# Patient Record
Sex: Male | Born: 1970 | Race: White | Hispanic: No | Marital: Married | Smoking: Former smoker
Health system: Southern US, Community
[De-identification: ages and names within clinical notes are randomized; demographics above are authoritative.]

## PROBLEM LIST (undated history)

## (undated) DIAGNOSIS — M545 Low back pain, unspecified: Secondary | ICD-10-CM

## (undated) HISTORY — PX: ABDOMINAL SURGERY: SHX537

## (undated) HISTORY — PX: COLON SURGERY: SHX602

## (undated) HISTORY — DX: Low back pain: M54.5

## (undated) HISTORY — DX: Low back pain, unspecified: M54.50

---

## 2009-10-18 ENCOUNTER — Emergency Department: Payer: Self-pay | Admitting: Emergency Medicine

## 2011-01-06 ENCOUNTER — Emergency Department: Payer: Self-pay | Admitting: *Deleted
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
## 2012-08-17 ENCOUNTER — Ambulatory Visit: Payer: Self-pay

## 2014-03-18 IMAGING — CT CT ABD-PELV W/ CM
1 of 2 series · 15 of 32 positions shown, 19 images · IV contrast (isovue)
Comparison: 01/06/2011

REASON FOR EXAM: LLQ abd pain
COMMENTS:

PROCEDURE:     CT  - CT ABDOMEN / PELVIS  W  - August 17, 2012  [DATE]
RESULT:     History: Abdominal pain
TECHNIQUE: Multiple axial images of the abdomen and pelvis were performed
from the lung bases to the pubic symphysis, with p.o. contrast and with 100
ml of Isovue .300 intravenous contrast.

[Series 2: 3mm soft tissue · axial · 0.72mm/px · z∈[-334,+150]mm · 15 of 175 slices shown, 19 images]
[im 7/175  soft-tissue]
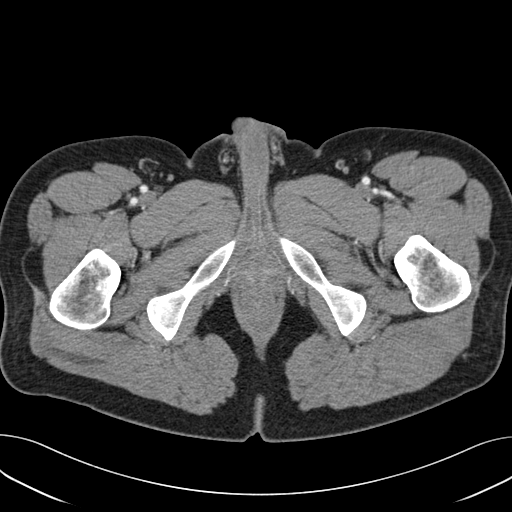
[im 7/175  bone]
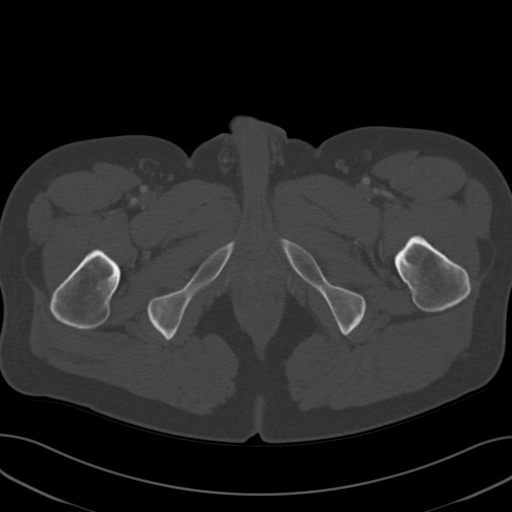
[im 21/175  soft-tissue]
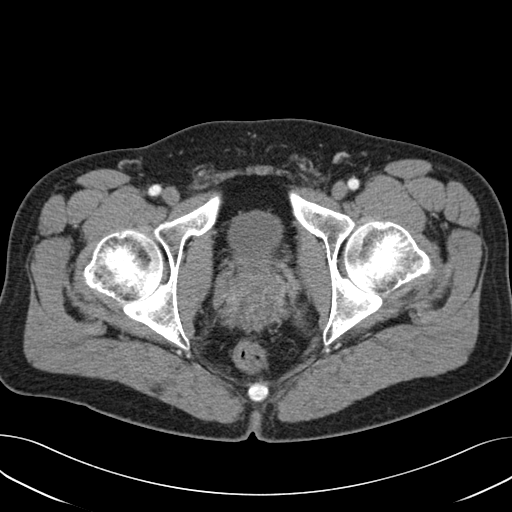
[im 35/175  soft-tissue]
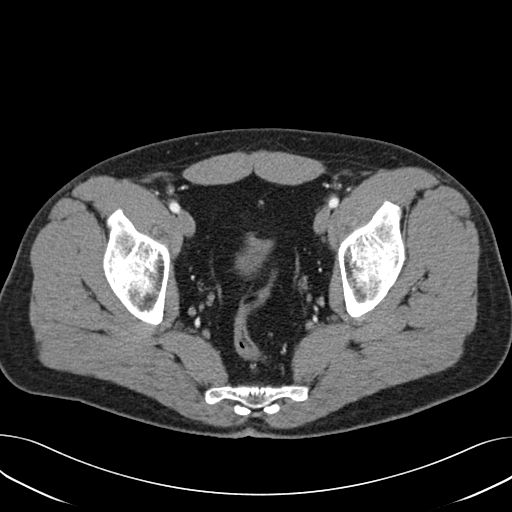
[im 49/175  soft-tissue]
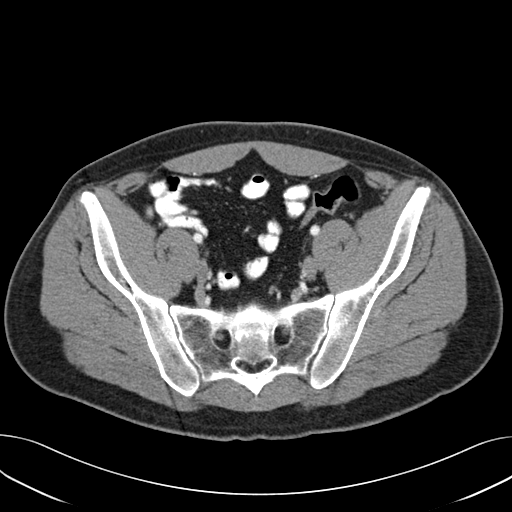
[im 63/175  soft-tissue]
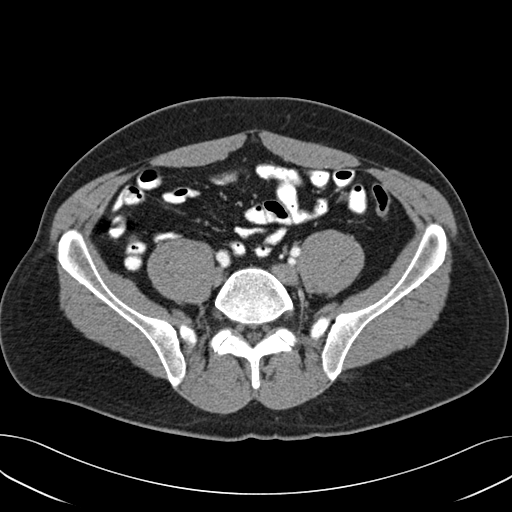
[im 77/175  soft-tissue]
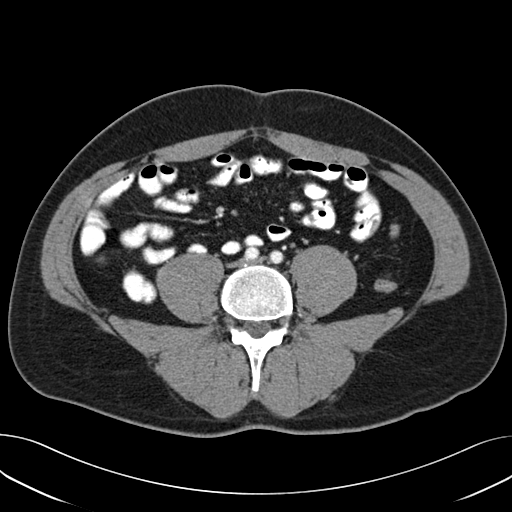
[im 91/175  soft-tissue]
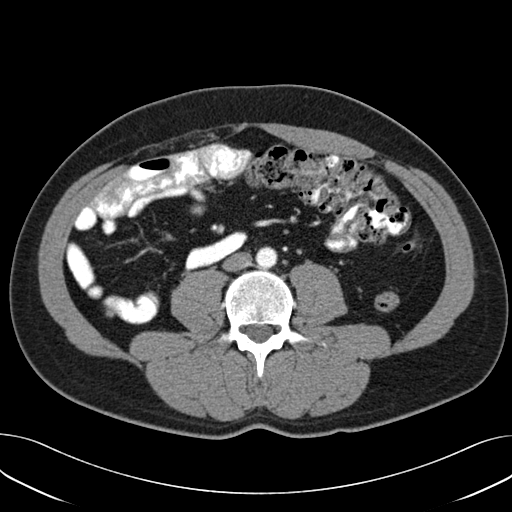
[im 98/175  soft-tissue]
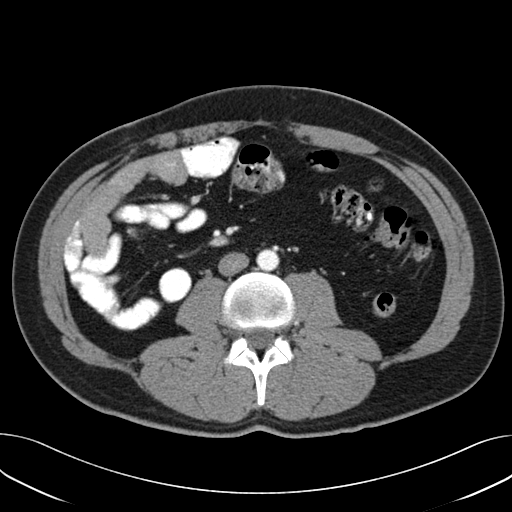
[im 112/175  soft-tissue]
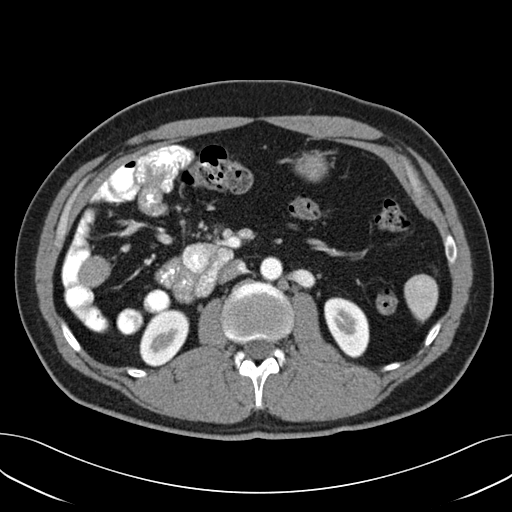
[im 112/175  bone]
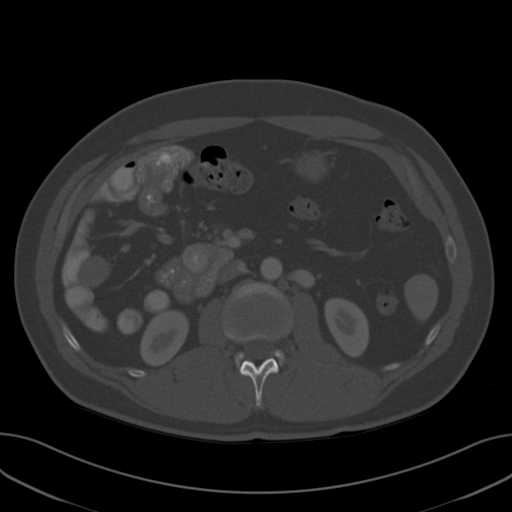
[im 126/175  soft-tissue]
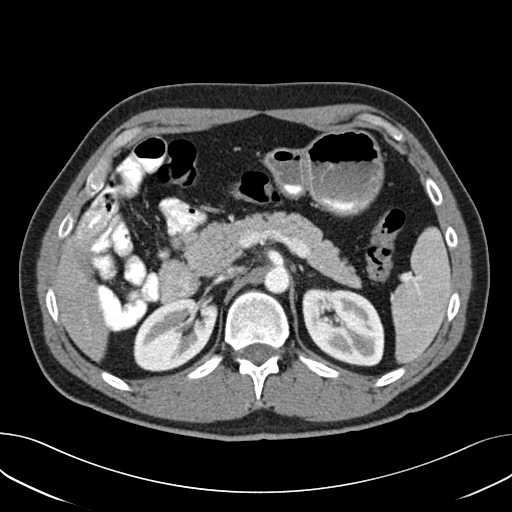
[im 140/175  soft-tissue]
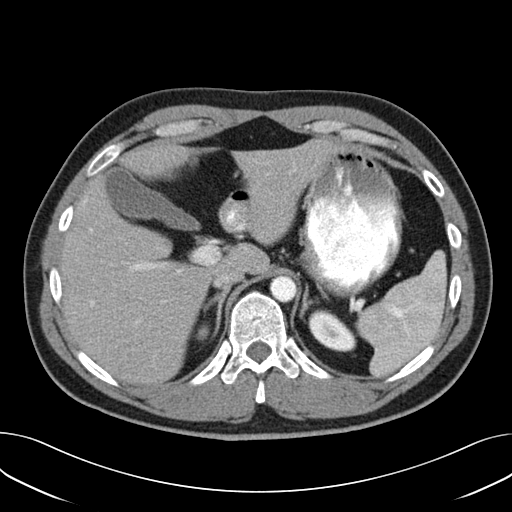
[im 147/175  lung]
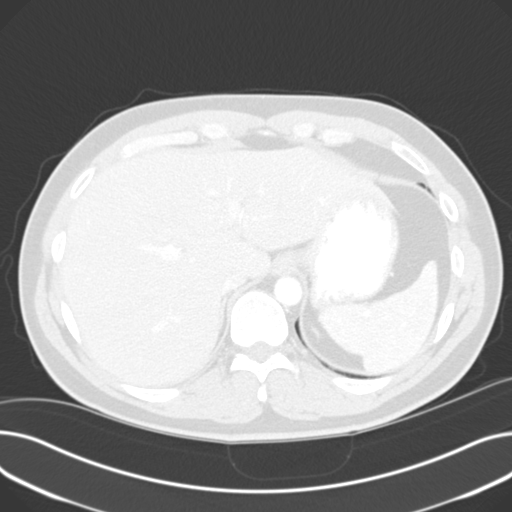
[im 154/175  soft-tissue]
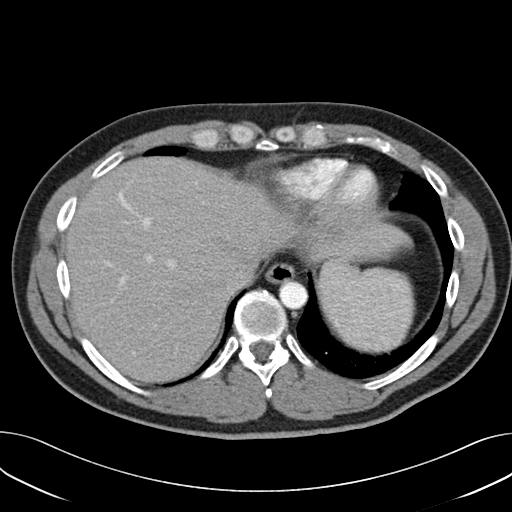
[im 154/175  lung]
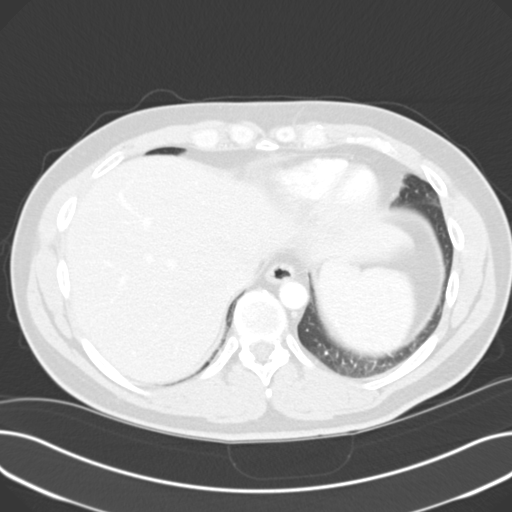
[im 161/175  lung]
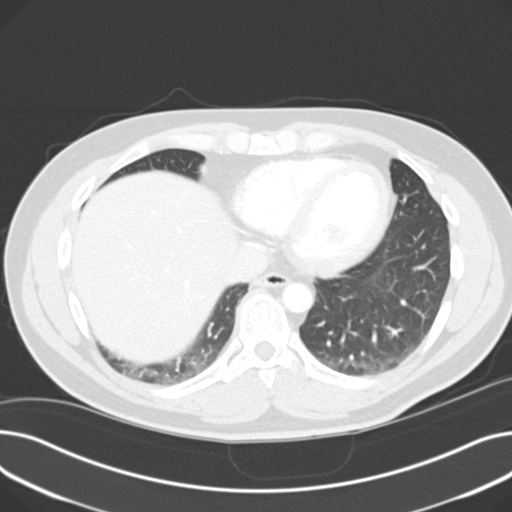
[im 168/175  soft-tissue]
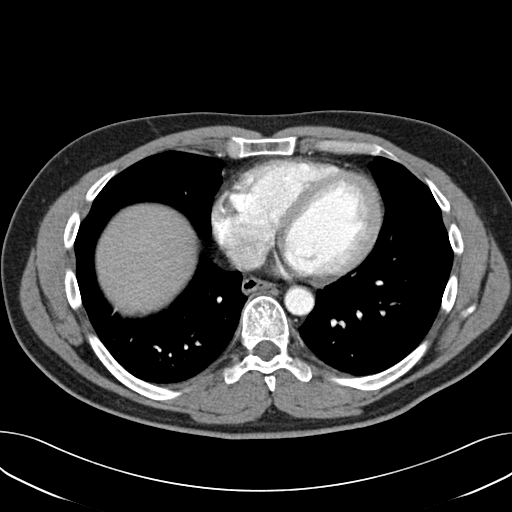
[im 168/175  lung]
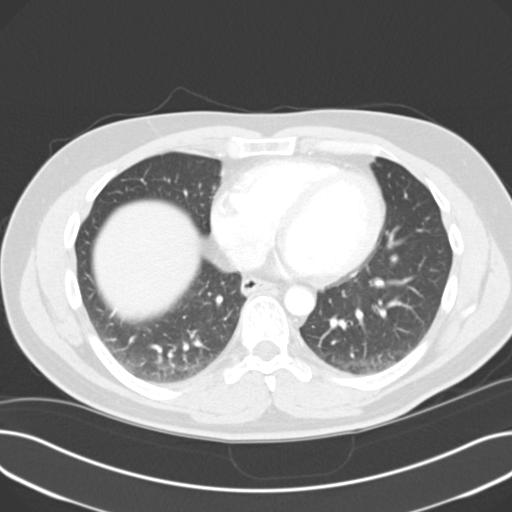

[15 of 32 positions shown; findings below may reference images not displayed]

FINDINGS: The lung bases are clear. There is no pneumothorax. The heart size is
normal.

The liver demonstrates no focal abnormality. There is no intrahepatic or
extrahepatic biliary ductal dilatation. The gallbladder is unremarkable. The
spleen demonstrates no focal abnormality. The kidneys, adrenal glands, and
pancreas are normal. The bladder is unremarkable.

The stomach, duodenum, small intestine, and large intestine demonstrate no
contrast extravasation or dilatation.  There is no pneumoperitoneum,
pneumatosis, or portal venous gas. There is no abdominal or pelvic free
fluid. There is no lymphadenopathy.

The abdominal aorta is normal in caliber .

The osseous structures are unremarkable.
IMPRESSION: 1. No acute abdominal or pelvic pathology.

[REDACTED]

## 2014-10-31 DIAGNOSIS — M545 Low back pain, unspecified: Secondary | ICD-10-CM | POA: Insufficient documentation

## 2014-11-02 ENCOUNTER — Ambulatory Visit: Payer: Self-pay | Admitting: Unknown Physician Specialty

## 2018-09-26 ENCOUNTER — Other Ambulatory Visit: Payer: Self-pay

## 2018-09-26 ENCOUNTER — Ambulatory Visit: Payer: Self-pay | Admitting: *Deleted

## 2018-09-26 DIAGNOSIS — Z20822 Contact with and (suspected) exposure to covid-19: Secondary | ICD-10-CM

## 2018-09-26 NOTE — Telephone Encounter (Signed)
Beverley Fiedler, NP called requesting to schedule a patient, Russell Hutchinson, for covid-19 testing. She stated the patient has had nausea, vomiting, diarrhea for 7 days and a generalized malaise.   Pt was notified regarding request to tested for the covid-19 test and scheduled for today 09/26/18 at 2 pm at the [facility] in [place]. He was advised to wear a mask, stay in the car and keep his window rolled up until time for his testing. Pt voiced understanding.

## 2018-09-29 LAB — NOVEL CORONAVIRUS, NAA: SARS-CoV-2, NAA: NOT DETECTED

## 2019-02-21 ENCOUNTER — Encounter: Payer: Self-pay | Admitting: Emergency Medicine

## 2019-02-21 ENCOUNTER — Other Ambulatory Visit: Payer: Self-pay

## 2019-02-21 ENCOUNTER — Ambulatory Visit
Admission: EM | Admit: 2019-02-21 | Discharge: 2019-02-21 | Disposition: A | Discharge status: Home / Self Care | Payer: HRSA Program | Attending: Urgent Care | Admitting: Urgent Care

## 2019-02-21 DIAGNOSIS — Z20822 Contact with and (suspected) exposure to covid-19: Secondary | ICD-10-CM

## 2019-02-21 DIAGNOSIS — B349 Viral infection, unspecified: Secondary | ICD-10-CM

## 2019-02-21 DIAGNOSIS — R519 Headache, unspecified: Secondary | ICD-10-CM | POA: Diagnosis not present

## 2019-02-21 DIAGNOSIS — Z20828 Contact with and (suspected) exposure to other viral communicable diseases: Secondary | ICD-10-CM

## 2019-02-21 DIAGNOSIS — R5383 Other fatigue: Secondary | ICD-10-CM

## 2019-02-21 DIAGNOSIS — Z7189 Other specified counseling: Secondary | ICD-10-CM

## 2019-02-21 DIAGNOSIS — R112 Nausea with vomiting, unspecified: Secondary | ICD-10-CM

## 2019-02-21 MED ORDER — ONDANSETRON 4 MG PO TBDP: 4.0000 mg | ORAL_TABLET | Freq: Three times a day (TID) | ORAL | 0 refills | Status: DC | PRN

## 2019-02-21 NOTE — Discharge Instructions (Signed)
It was very nice seeing you today in clinic. Thank you for entrusting me with your care.   Rest and increase fluid intake as much as possible. Water is always best, as sugar and caffeine containing fluids can cause you to become dehydrated. Try to incorporate electrolyte enriched fluids, such as Gatorade or Pedialyte, into your daily fluid intake.  I have sent you in some nausea medication. Your prescriptions has been called in to your pharmacy.   You were tested for SARS-CoV-2 (novel coronavirus) today. Testing is performed by an outside lab ([facility]) and has variable turn around times ranging between 2-5 days. Current recommendations from the the CDC and [place] DHHS require that you remain out of work in order to quarantine at home until negative test results are have been received. In the event that your test results are positive, you will be contacted with further directives. These measures are being implemented out of an abundance of caution to prevent transmission and spread during the current SARS-CoV-2 pandemic.  Make arrangements to follow up with your regular doctor in 1 week for re-evaluation if not improving. If your symptoms/condition worsens, please seek follow up care either here or in the ER. Please remember, our [place] providers are "right here with you" when you need [place].   Again, it was my pleasure to take care of you today. Thank you for choosing our clinic. I hope that you start to feel better quickly.   Honor Loh, MSN, APRN, FNP-C, CEN Advanced Practice Provider [facility]

## 2019-02-21 NOTE — ED Triage Notes (Signed)
Patient in today c/o emesis since yesterday. Patient denies fever or diarrhea.

## 2019-02-22 NOTE — ED Provider Notes (Signed)
Russell Hutchinson, Russell Hutchinson   Name: Russell Hutchinson DOB: Jun 06, 1970 MRN: [id number] CSN: [id number] PCP: Patient, No Pcp Per  Arrival date and time:  02/21/19 1731  Chief Complaint:  Emesis   NOTE: Prior to seeing the patient today, I have reviewed the triage nursing documentation and vital signs. Clinical staff has updated patient's PMH/PSHx, current medication list, and drug allergies/intolerances to ensure comprehensive history available to assist in medical decision making.   History:   HPI: Russell Hutchinson is a 48 y.o. male who presents today with complaints of multiple emesis episodes since yesterday. Patient unable to advise how many times he has vomited beyond stating "a lot". Emesis described as being bilious with no evidence of hematemesis. Patient denies any fevers, chills, abdominal pain, and urinary symptoms. He reports that he has had a generalized headache and mild vertiginous symptoms. Presenting VS reviewed as normal; no fever, hypotension, or tachycardia. Vertiginous symptoms mainly associated with rapid position changes; no noted orthostasis. Patient has maintained a bland diet which has allowed him to be able to eat and drink "fine". He has been consuming mainly water per his report. Patient reports a PMH (+) for recurrent nausea and vomiting that he attributes to major changes in his work schedule. Patient denies being in close contact with anyone known to be ill. He has been tested for SARS-CoV-2 (novel coronavirus) back in 09/2018. Despite his symptoms, patient has not taken any over the counter interventions to help improve/relieve his reported symptoms at home.   Past Medical History:  Diagnosis Date  . Lumbago     Past Surgical History:  Procedure Laterality Date  . ABDOMINAL SURGERY  as infant  . COLON SURGERY     blockage    Family History  Problem Relation Age of Onset  . Healthy Mother   . Healthy Father     Social History   Tobacco Use  . Smoking status: Former  Smoker    Quit date: 02/21/2008    Years since quitting: 11.0  . Smokeless tobacco: Never Used  Substance Use Topics  . Alcohol use: Yes    Comment: socially  . Drug use: Yes    Frequency: 3.0 times per week    Types: Marijuana    Patient Active Problem List   Diagnosis Date Noted  . Lumbago 10/31/2014    Home Medications:    No outpatient medications have been marked as taking for the 02/21/19 encounter [facility](Hospital Encounter).    Allergies:   Zithromax [azithromycin]  Review of Systems (ROS): Review of Systems  Constitutional: Positive for appetite change (decreased) and fatigue. Negative for fever.  HENT: Negative for congestion, ear pain, postnasal drip, rhinorrhea, sinus pressure, sinus pain, sneezing and sore throat.   Eyes: Negative for pain, discharge and redness.  Respiratory: Negative for cough, chest tightness and shortness of breath.   Cardiovascular: Negative for chest pain and palpitations.  Gastrointestinal: Positive for nausea and vomiting. Negative for abdominal pain and diarrhea.  Genitourinary: Negative for dysuria, flank pain, frequency, hematuria and urgency.       Voiding per his baseline habits.  Musculoskeletal: Negative for arthralgias, back pain, myalgias and neck pain.  Skin: Negative for color change, pallor and rash.  Neurological: Positive for dizziness, weakness (generalized) and headaches. Negative for syncope.  Hematological: Negative for adenopathy.     Vital Signs: Today's Vitals   02/21/19 1749 02/21/19 1824  BP: (!) 110/98   Pulse: 79   Resp: 18   Temp: 97.8 F (36.6 C)  TempSrc: Oral   SpO2: 99%   Weight: 202 lb (91.6 kg)   Height: 6\' 1"  (1.854 m)   PainSc: 2  2     Physical Exam: Physical Exam  Constitutional: He is oriented to person, place, and time and well-developed, well-nourished, and in no distress. No distress.  HENT:  Head: Normocephalic and atraumatic.  Nose: Nose normal.  Mouth/Throat: Oropharynx is clear  and moist.  Eyes: Pupils are equal, round, and reactive to light. Conjunctivae and EOM are normal.  Neck: Normal range of motion. Neck supple.  Cardiovascular: Normal rate, regular rhythm, normal heart sounds and intact distal pulses. Exam reveals no gallop and no friction rub.  No murmur heard. Pulmonary/Chest: Effort normal. No respiratory distress. He has no decreased breath sounds. He has no wheezes. He has no rhonchi. He has no rales.  Abdominal: Soft. Bowel sounds are normal. He exhibits no distension. There is no abdominal tenderness.  Musculoskeletal: Normal range of motion.  Neurological: He is alert and oriented to person, place, and time. He has normal motor skills. Gait normal.  Skin: Skin is warm and dry. No rash noted. He is not diaphoretic.  Psychiatric: Mood, memory, affect and judgment normal.  Nursing note and vitals reviewed.   Urgent Care Treatments / Results:   LABS: PLEASE NOTE: all labs that were ordered this encounter are listed, however only abnormal results are displayed. Labs Reviewed  NOVEL CORONAVIRUS, NAA (HOSP ORDER, SEND-OUT TO REF LAB; TAT 18-24 HRS)    EKG: -None  RADIOLOGY: No results found.  PROCEDURES: Procedures  MEDICATIONS RECEIVED THIS VISIT: Medications - No data to display  PERTINENT CLINICAL COURSE NOTES/UPDATES:   Initial Impression / Assessment and Plan / Urgent Care Course:  Pertinent labs & imaging results that were available during my care of the patient were personally reviewed by me and considered in my medical decision making (see lab/imaging section of note for values and interpretations).  Russell Hutchinson is a 47 y.o. male who presents to [facility] today with complaints of Emesis   Patient overall well appearing and in no acute distress today in clinic. Presenting symptoms (see HPI) and exam as documented above. He presents with symptoms associated with SARS-CoV-2 (novel coronavirus). Discussed typical symptom  constellation. Reviewed potential for infection and need for testing. Patient amenable to being tested. SARS-CoV-2 swab collected by certified clinical staff. Discussed variable turn around times associated with testing, as swabs are being processed at [facility], and have been taking between 2-5 days to come back. He was advised to self quarantine, per [facility] DHHS guidelines, until negative results received.   Symptoms likely viral in nature. Discussed that SARS-CoV-2 remains part of the differential until testing proves otherwise. I discussed with him that his symptoms are felt to be viral in nature, thus antibiotics would not offer him any relief or improve his symptoms any faster than conservative symptomatic management. Discussed supportive care measures at home during acute phase of illness. Patient to rest as much as possible. He was encouraged to ensure adequate hydration (water and ORS) to prevent dehydration and electrolyte derangements. Patient may use APAP and/or IBU on an as needed basis for headache. Will send in a supply of ondansetron ODT tablets for patient to use on a PRN basis.     Current clinical condition warrants patient being out of work in order to quarantine while waiting for testing results. He was provided with the appropriate documentation to provide to his place of employment that will  allow for him to RTW on 02/25/2019 with no restrictions. RTW is contingent on his SARS-CoV-2 test results being reviewed as negative.    Discussed follow up with primary care physician in 1 week for re-evaluation. I have reviewed the follow up and strict return precautions for any new or worsening symptoms. Patient is aware of symptoms that would be deemed urgent/emergent, and would thus require further evaluation either here or in the emergency department. At the time of discharge, he verbalized understanding and consent with the discharge plan as it was reviewed with him. All questions were fielded by  provider and/or clinic staff prior to patient discharge.    Final Clinical Impressions / Urgent Care Diagnoses:   Final diagnoses:  Viral illness  Encounter for laboratory testing for COVID-19 virus  Advice given about COVID-19 virus infection    New Prescriptions:  [place] Controlled Substance Registry consulted? Not Applicable  Meds ordered this encounter  Medications  . ondansetron (ZOFRAN-ODT) 4 MG disintegrating tablet    Sig: Take 1 tablet (4 mg total) by mouth every 8 (eight) hours as needed.    Dispense:  15 tablet    Refill:  0    Recommended Follow up Care:  Patient encouraged to follow up with the following provider within the specified time frame, or sooner as dictated by the severity of his symptoms. As always, he was instructed that for any urgent/emergent care needs, he should seek care either here or in the emergency department for more immediate evaluation.  Follow-up Information    PCP In 1 week.   Why: General reassessment of symptoms if not improving        NOTE: This note was prepared using Lobbyist along with smaller Company secretary. Despite my best ability to proofread, there is the potential that transcriptional errors may still occur from this process, and are completely unintentional.    Karen Kitchens, NP 02/22/19 2153

## 2019-02-23 LAB — NOVEL CORONAVIRUS, NAA (HOSP ORDER, SEND-OUT TO REF LAB; TAT 18-24 HRS): SARS-CoV-2, NAA: NOT DETECTED

## 2019-05-23 ENCOUNTER — Encounter: Payer: Self-pay | Admitting: Emergency Medicine

## 2019-05-23 ENCOUNTER — Ambulatory Visit
Admission: EM | Admit: 2019-05-23 | Discharge: 2019-05-23 | Disposition: A | Discharge status: Home / Self Care | Payer: PRIVATE HEALTH INSURANCE | Attending: Family Medicine | Admitting: Family Medicine

## 2019-05-23 ENCOUNTER — Other Ambulatory Visit: Payer: Self-pay

## 2019-05-23 DIAGNOSIS — Z7189 Other specified counseling: Secondary | ICD-10-CM | POA: Diagnosis present

## 2019-05-23 DIAGNOSIS — Z20822 Contact with and (suspected) exposure to covid-19: Secondary | ICD-10-CM | POA: Diagnosis not present

## 2019-05-23 DIAGNOSIS — R112 Nausea with vomiting, unspecified: Secondary | ICD-10-CM

## 2019-05-23 DIAGNOSIS — R197 Diarrhea, unspecified: Secondary | ICD-10-CM | POA: Insufficient documentation

## 2019-05-23 DIAGNOSIS — Z87891 Personal history of nicotine dependence: Secondary | ICD-10-CM | POA: Diagnosis not present

## 2019-05-23 MED ORDER — ONDANSETRON 4 MG PO TBDP: 4.0000 mg | ORAL_TABLET | Freq: Three times a day (TID) | ORAL | 0 refills | Status: DC | PRN

## 2019-05-23 NOTE — ED Provider Notes (Signed)
[facility] ____________________________________________  Time seen: Approximately 12:06 PM  I have reviewed the triage vital signs and the nursing notes.   HISTORY  Chief Complaint Emesis and Diarrhea   HPI Russell Hutchinson is a 49 y.o. male presenting for evaluation of nausea, vomiting and diarrhea.  Patient reports yesterday morning he had multiple episodes of vomiting and diarrhea.  States this stopped throughout the afternoon, but reports still feeling weak and tired.  Denies accompanying abdominal pain.  Did have some abdominal discomfort yesterday, none today.  Denies fevers.  Denies cough, congestion.  Reports normal colored vomit and stool, denies blood in stool or vomit.  Denies known sick contacts.  Denies known food trigger.  States this feels like in the past when he had viral GI symptoms.  Has tolerated some fluids today.  Denies aggravating or alleviating factors.    Past Medical History:  Diagnosis Date  . Lumbago     Patient Active Problem List   Diagnosis Date Noted  . Lumbago 10/31/2014    Past Surgical History:  Procedure Laterality Date  . ABDOMINAL SURGERY  as infant  . COLON SURGERY     blockage     No current facility-administered medications for this encounter.  Current Outpatient Medications:  .  ondansetron (ZOFRAN ODT) 4 MG disintegrating tablet, Take 1 tablet (4 mg total) by mouth every 8 (eight) hours as needed for nausea or vomiting., Disp: 15 tablet, Rfl: 0  Allergies Zithromax [azithromycin]  Family History  Problem Relation Age of Onset  . Healthy Mother   . Healthy Father     Social History Social History   Tobacco Use  . Smoking status: Former Smoker    Quit date: 02/21/2008    Years since quitting: 11.2  . Smokeless tobacco: Never Used  Substance Use Topics  . Alcohol use: Yes    Comment: socially  . Drug use: Yes    Frequency: 3.0 times per week    Types: Marijuana    Review of Systems Constitutional:  No fever ENT: No sore throat. Cardiovascular: Denies chest pain. Respiratory: Denies shortness of breath. Gastrointestinal: as above.  Genitourinary: Negative for dysuria. Musculoskeletal: Negative for back pain. Skin: Negative for rash.  ____________________________________________   PHYSICAL EXAM:  VITAL SIGNS: ED Triage Vitals [05/23/19 1138]  Enc Vitals Group     BP 125/89     Pulse Rate 79     Resp 18     Temp 98.2 F (36.8 C)     Temp Source Oral     SpO2 100 %     Weight 200 lb (90.7 kg)     Height 6\' 1"  (1.854 m)     Head Circumference      Peak Flow      Pain Score 0     Pain Loc      Pain Edu?      Excl. in GC?     Constitutional: Alert and oriented. Well appearing and in no acute distress. Eyes: Conjunctivae are normal. ENT      Head: Normocephalic and atraumatic. Cardiovascular: Normal rate, regular rhythm. Grossly normal heart sounds.  Good peripheral circulation. Respiratory: Normal respiratory effort without tachypnea nor retractions. Breath sounds are clear and equal bilaterally. No wheezes, rales, rhonchi. Gastrointestinal: Normal Bowel sounds. Minimal diffuse lower abdominal tenderness. Abdomen otherwise soft and nontender. No CVA tenderness. Musculoskeletal: Steady gait.  Neurologic:  Normal speech and language. Speech is normal. No gait instability.  Skin:  Skin is warm,  dry and intact. No rash noted. Psychiatric: Mood and affect are normal. Speech and behavior are normal. Patient exhibits appropriate insight and judgment   ___________________________________________   LABS (all labs ordered are listed, but only abnormal results are displayed)  Labs Reviewed  NOVEL CORONAVIRUS, NAA (HOSP ORDER, SEND-OUT TO REF LAB; TAT 18-24 HRS)    PROCEDURES Procedures   INITIAL IMPRESSION / ASSESSMENT AND PLAN / ED COURSE  Pertinent labs & imaging results that were available during my care of the patient were reviewed by me and considered in my  medical decision making (see chart for details).  Overall well-appearing patient.  No acute distress.  Suspect viral illness.  COVID-19 testing completed advice given.  As needed Zofran.  Rest, fluids, brat diet.  Gradually increase clear liquids to thicker.  Discussed follow-up and return parameters.  Work note given.  Discussed indication, risks and benefits of medications with patient. Discussed follow up and return parameters including no resolution or any worsening concerns. Patient verbalized understanding and agreed to plan.   ____________________________________________   FINAL CLINICAL IMPRESSION(S) / ED DIAGNOSES  Final diagnoses:  Nausea vomiting and diarrhea  Advice given about COVID-19 virus infection     ED Discharge Orders         Ordered    ondansetron (ZOFRAN ODT) 4 MG disintegrating tablet  Every 8 hours PRN     05/23/19 1154           Note: This dictation was prepared with Dragon dictation along with smaller phrase technology. Any transcriptional errors that result from this process are unintentional.         Marylene Land, NP 05/23/19 1219

## 2019-05-23 NOTE — Discharge Instructions (Signed)
Take medication as prescribed. Rest. Drink plenty of fluids.  ° °Follow up with your primary care physician this week as needed. Return to Urgent care for new or worsening concerns.  ° °

## 2019-05-23 NOTE — ED Triage Notes (Signed)
Patient states "he didn't feel good" on Thursday. States he was in bed all day. Monday he states he woke up and started having diarrhea and vomiting. Denies fever. Has not had any diarrhea or vomiting today.

## 2019-05-24 LAB — NOVEL CORONAVIRUS, NAA (HOSP ORDER, SEND-OUT TO REF LAB; TAT 18-24 HRS): SARS-CoV-2, NAA: NOT DETECTED

## 2019-12-05 ENCOUNTER — Ambulatory Visit
Admission: RE | Admit: 2019-12-05 | Discharge: 2019-12-05 | Disposition: A | Discharge status: Home / Self Care | Payer: PRIVATE HEALTH INSURANCE | Source: Ambulatory Visit | Attending: Family Medicine | Admitting: Family Medicine

## 2019-12-05 ENCOUNTER — Other Ambulatory Visit: Payer: Self-pay

## 2019-12-05 VITALS — BP 122/81 | HR 60 | Temp 98.0°F | Resp 18 | Ht 73.0 in | Wt 180.0 lb

## 2019-12-05 DIAGNOSIS — R1111 Vomiting without nausea: Secondary | ICD-10-CM

## 2019-12-05 LAB — COMPREHENSIVE METABOLIC PANEL
ALT: 45 U/L — ABNORMAL HIGH (ref 0–44)
AST: 25 U/L (ref 15–41)
Albumin: 4.3 g/dL (ref 3.5–5.0)
Alkaline Phosphatase: 72 U/L (ref 38–126)
Anion gap: 9 (ref 5–15)
BUN: 12 mg/dL (ref 6–20)
CO2: 27 mmol/L (ref 22–32)
Calcium: 9.2 mg/dL (ref 8.9–10.3)
Chloride: 103 mmol/L (ref 98–111)
Creatinine, Ser: 1.08 mg/dL (ref 0.61–1.24)
GFR calc Af Amer: >60 mL/min (ref >60)
GFR calc non Af Amer: >60 mL/min (ref >60)
Glucose, Bld: 111 mg/dL — ABNORMAL HIGH (ref 70–99)
Potassium: 4.4 mmol/L (ref 3.5–5.1)
Sodium: 139 mmol/L (ref 135–145)
Total Bilirubin: 0.4 mg/dL (ref 0.3–1.2)
Total Protein: 7.3 g/dL (ref 6.5–8.1)

## 2019-12-05 LAB — CBC WITH DIFFERENTIAL/PLATELET
Abs Immature Granulocytes: 0.03 10*3/uL (ref 0.00–0.07)
Basophils Absolute: 0.1 10*3/uL (ref 0.0–0.1)
Basophils Relative: 1 %
Eosinophils Absolute: 0.1 10*3/uL (ref 0.0–0.5)
Eosinophils Relative: 2 %
HCT: 45.1 % (ref 39.0–52.0)
Hemoglobin: 14.7 g/dL (ref 13.0–17.0)
Immature Granulocytes: 0 %
Lymphocytes Relative: 16 %
Lymphs Abs: 1.2 10*3/uL (ref 0.7–4.0)
MCH: 29.3 pg (ref 26.0–34.0)
MCHC: 32.6 g/dL (ref 30.0–36.0)
MCV: 89.8 fL (ref 80.0–100.0)
Monocytes Absolute: 0.5 10*3/uL (ref 0.1–1.0)
Monocytes Relative: 7 %
Neutro Abs: 5.4 10*3/uL (ref 1.7–7.7)
Neutrophils Relative %: 74 %
Platelets: 179 10*3/uL (ref 150–400)
RBC: 5.02 MIL/uL (ref 4.22–5.81)
RDW: 13.5 % (ref 11.5–15.5)
WBC: 7.2 10*3/uL (ref 4.0–10.5)
nRBC: 0 % (ref 0.0–0.2)

## 2019-12-05 LAB — LIPASE, BLOOD: Lipase: 22 U/L (ref 11–51)

## 2019-12-05 MED ORDER — ONDANSETRON 4 MG PO TBDP: 4.0000 mg | ORAL_TABLET | Freq: Three times a day (TID) | ORAL | 0 refills | Status: AC | PRN

## 2019-12-05 MED ORDER — PANTOPRAZOLE SODIUM 40 MG PO TBEC: 40.0000 mg | DELAYED_RELEASE_TABLET | Freq: Every day | ORAL | 0 refills | Status: AC

## 2019-12-05 NOTE — ED Triage Notes (Signed)
Patient c/o vomiting that started yesterday. Denies any other symptoms.

## 2019-12-05 NOTE — Discharge Instructions (Signed)
Labs unremarkable.  Medication as prescribed.  Bland diet.  If persists, recommend seeing [facility].

## 2019-12-05 NOTE — ED Provider Notes (Signed)
[facility]    CSN: [id number] Arrival date & time: 12/05/19  1148  History   Chief Complaint Chief Complaint  Patient presents with  . Appointment  . Emesis   HPI  49 year old male presents with vomiting.  Started yesterday.  Patient states that he awoke and subsequently vomited.  He states that he vomited the contents of what he had ate the night before.  Patient reports that he was essentially asymptomatic throughout the day although he had a decrease in appetite.  He ate again last night and woke up this morning with vomiting again.  He denies nausea.  No abdominal pain.  No fever.  No medications or interventions tried.  No relieving factors.  Past Medical History:  Diagnosis Date  . Lumbago    Patient Active Problem List   Diagnosis Date Noted  . Lumbago 10/31/2014   Past Surgical History:  Procedure Laterality Date  . ABDOMINAL SURGERY  as infant  . COLON SURGERY     blockage   Home Medications    Prior to Admission medications   Medication Sig Start Date End Date Taking? Authorizing Provider  ondansetron (ZOFRAN ODT) 4 MG disintegrating tablet Take 1 tablet (4 mg total) by mouth every 8 (eight) hours as needed for nausea or vomiting. 12/05/19   Tommie Sams, DO  pantoprazole (PROTONIX) 40 MG tablet Take 1 tablet (40 mg total) by mouth daily. 12/05/19   Tommie Sams, DO    Family History Family History  Problem Relation Age of Onset  . Healthy Mother   . Healthy Father     Social History Social History   Tobacco Use  . Smoking status: Former Smoker    Quit date: 02/21/2008    Years since quitting: 11.7  . Smokeless tobacco: Never Used  Vaping Use  . Vaping Use: Never used  Substance Use Topics  . Alcohol use: Yes    Comment: socially  . Drug use: Yes    Frequency: 3.0 times per week    Types: Marijuana     Allergies   Zithromax [azithromycin]   Review of Systems Review of Systems  Constitutional: Negative for fever.    Gastrointestinal: Positive for vomiting.   Physical Exam Triage Vital Signs ED Triage Vitals  Enc Vitals Group     BP 12/05/19 1216 122/81     Pulse Rate 12/05/19 1216 60     Resp 12/05/19 1216 18     Temp 12/05/19 1216 98 F (36.7 C)     Temp Source 12/05/19 1216 Oral     SpO2 12/05/19 1216 100 %     Weight 12/05/19 1215 180 lb (81.6 kg)     Height 12/05/19 1215 6\' 1"  (1.854 m)     Head Circumference --      Peak Flow --      Pain Score 12/05/19 1215 0     Pain Loc --      Pain Edu? --      Excl. in [place]? --    Updated Vital Signs BP 122/81 (BP Location: Right Arm)   Pulse 60   Temp 98 F (36.7 C) (Oral)   Resp 18   Ht 6\' 1"  (1.854 m)   Wt 81.6 kg   SpO2 100%   BMI 23.75 kg/m   Visual Acuity Right Eye Distance:   Left Eye Distance:   Bilateral Distance:    Right Eye Near:   Left Eye Near:    Bilateral Near:  Physical Exam Vitals and nursing note reviewed.  Constitutional:      General: He is not in acute distress.    Appearance: Normal appearance. He is not ill-appearing.  HENT:     Head: Normocephalic and atraumatic.  Eyes:     General:        Right eye: No discharge.        Left eye: No discharge.     Conjunctiva/sclera: Conjunctivae normal.  Cardiovascular:     Rate and Rhythm: Normal rate and regular rhythm.  Pulmonary:     Effort: Pulmonary effort is normal.     Breath sounds: Normal breath sounds. No wheezing, rhonchi or rales.  Abdominal:     General: There is no distension.     Palpations: Abdomen is soft.     Tenderness: There is no abdominal tenderness.  Neurological:     Mental Status: He is alert.  Psychiatric:        Mood and Affect: Mood normal.        Behavior: Behavior normal.    UC Treatments / Results  Labs (all labs ordered are listed, but only abnormal results are displayed) Labs Reviewed  COMPREHENSIVE METABOLIC PANEL - Abnormal; Notable for the following components:      Result Value   Glucose, Bld 111 (*)    ALT  45 (*)    All other components within normal limits  CBC WITH DIFFERENTIAL/PLATELET  LIPASE, BLOOD    EKG   Radiology No results found.  Procedures Procedures (including critical care time)  Medications Ordered in UC Medications - No data to display  Initial Impression / Assessment and Plan / UC Course  I have reviewed the triage vital signs and the nursing notes.  Pertinent labs & imaging results that were available during my care of the patient were reviewed by me and considered in my medical decision making (see chart for details).    49 year old male presents with non-intractable vomiting.  Laboratory studies obtained and are unremarkable.  No vomiting here.  Placing on Protonix and Zofran.  Supportive care.  Final Clinical Impressions(s) / UC Diagnoses   Final diagnoses:  Non-intractable vomiting without nausea, unspecified vomiting type     Discharge Instructions     Labs unremarkable.  Medication as prescribed.  Bland diet.  If persists, recommend seeing [facility].   ED Prescriptions    Medication Sig Dispense Auth. Provider   pantoprazole (PROTONIX) 40 MG tablet Take 1 tablet (40 mg total) by mouth daily. 30 tablet Tifanie Gardiner G, DO   ondansetron (ZOFRAN ODT) 4 MG disintegrating tablet Take 1 tablet (4 mg total) by mouth every 8 (eight) hours as needed for nausea or vomiting. 20 tablet Tommie Sams, DO     PDMP not reviewed this encounter.   Tommie Sams, [place] 12/05/19 2113

## 2020-09-18 ENCOUNTER — Encounter: Payer: Self-pay | Admitting: Emergency Medicine

## 2020-09-18 ENCOUNTER — Other Ambulatory Visit: Payer: Self-pay

## 2020-09-18 ENCOUNTER — Ambulatory Visit
Admission: EM | Admit: 2020-09-18 | Discharge: 2020-09-18 | Disposition: A | Discharge status: Home / Self Care | Payer: PRIVATE HEALTH INSURANCE | Attending: Sports Medicine | Admitting: Sports Medicine

## 2020-09-18 DIAGNOSIS — R630 Anorexia: Secondary | ICD-10-CM | POA: Diagnosis not present

## 2020-09-18 DIAGNOSIS — D696 Thrombocytopenia, unspecified: Secondary | ICD-10-CM | POA: Diagnosis not present

## 2020-09-18 DIAGNOSIS — R112 Nausea with vomiting, unspecified: Secondary | ICD-10-CM | POA: Diagnosis present

## 2020-09-18 LAB — CBC WITH DIFFERENTIAL/PLATELET
Abs Immature Granulocytes: 0.02 10*3/uL (ref 0.00–0.07)
Basophils Absolute: 0 10*3/uL (ref 0.0–0.1)
Basophils Relative: 0 %
Eosinophils Absolute: 0 10*3/uL (ref 0.0–0.5)
Eosinophils Relative: 0 %
HCT: 46.7 % (ref 39.0–52.0)
Hemoglobin: 16 g/dL (ref 13.0–17.0)
Immature Granulocytes: 0 %
Lymphocytes Relative: 6 %
Lymphs Abs: 0.3 10*3/uL — ABNORMAL LOW (ref 0.7–4.0)
MCH: 29.7 pg (ref 26.0–34.0)
MCHC: 34.3 g/dL (ref 30.0–36.0)
MCV: 86.6 fL (ref 80.0–100.0)
Monocytes Absolute: 1.1 10*3/uL — ABNORMAL HIGH (ref 0.1–1.0)
Monocytes Relative: 23 %
Neutro Abs: 3.2 10*3/uL (ref 1.7–7.7)
Neutrophils Relative %: 71 %
Platelets: 124 10*3/uL — ABNORMAL LOW (ref 150–400)
RBC: 5.39 MIL/uL (ref 4.22–5.81)
RDW: 13.2 % (ref 11.5–15.5)
WBC: 4.6 10*3/uL (ref 4.0–10.5)
nRBC: 0 % (ref 0.0–0.2)

## 2020-09-18 LAB — URINALYSIS, COMPLETE (UACMP) WITH MICROSCOPIC
Glucose, UA: NEGATIVE mg/dL
Ketones, ur: NEGATIVE mg/dL
Leukocytes,Ua: NEGATIVE
Nitrite: NEGATIVE
Protein, ur: 30 mg/dL — AB
Specific Gravity, Urine: 1.025 (ref 1.005–1.030)
pH: 5.5 (ref 5.0–8.0)

## 2020-09-18 LAB — COMPREHENSIVE METABOLIC PANEL
ALT: 29 U/L (ref 0–44)
AST: 17 U/L (ref 15–41)
Albumin: 4.9 g/dL (ref 3.5–5.0)
Alkaline Phosphatase: 64 U/L (ref 38–126)
Anion gap: 10 (ref 5–15)
BUN: 21 mg/dL — ABNORMAL HIGH (ref 6–20)
CO2: 23 mmol/L (ref 22–32)
Calcium: 9.6 mg/dL (ref 8.9–10.3)
Chloride: 107 mmol/L (ref 98–111)
Creatinine, Ser: 1.22 mg/dL (ref 0.61–1.24)
GFR, Estimated: >60 mL/min (ref >60)
Glucose, Bld: 101 mg/dL — ABNORMAL HIGH (ref 70–99)
Potassium: 4.1 mmol/L (ref 3.5–5.1)
Sodium: 140 mmol/L (ref 135–145)
Total Bilirubin: 0.8 mg/dL (ref 0.3–1.2)
Total Protein: 7.9 g/dL (ref 6.5–8.1)

## 2020-09-18 MED ORDER — ONDANSETRON HCL 4 MG PO TABS: 4.0000 mg | ORAL_TABLET | Freq: Four times a day (QID) | ORAL | 0 refills | Status: AC

## 2020-09-18 MED ORDER — SODIUM CHLORIDE 0.9 % IV BOLUS
1000.0000 mL | Freq: Once | INTRAVENOUS | Status: AC
  Administered 2020-09-18: 1000 mL via INTRAVENOUS

## 2020-09-18 MED ORDER — ONDANSETRON HCL 4 MG/2ML IJ SOLN
4.0000 mg | Freq: Once | INTRAMUSCULAR | Status: AC
  Administered 2020-09-18: 4 mg via INTRAMUSCULAR

## 2020-09-18 NOTE — Discharge Instructions (Signed)
As we discussed, you have vomiting and some nausea.  Your vital signs and physical exam are reassuring.  He received 1 L of IV fluids. I also gave you an intramuscular injection of a antinausea antivomiting medicine.  I have given you the same medication orally and you can get that from your pharmacy. Please see educational handouts. Please continue to hydrate. I gave you a work note saying you were seen today and keep you out the rest of the week. If your symptoms were to worsen in any way please go to the ER.

## 2020-09-18 NOTE — ED Triage Notes (Signed)
Patient states he went out to dinner Saturday night and believes he may have eaten bad chicken. He states Sunday morning he felt weak. He reports vomiting once on Monday and states he vomited all night long last night. Denies diarrhea.

## 2020-09-18 NOTE — ED Provider Notes (Signed)
[facility]    CSN: [id number] Arrival date & time: 09/18/20  1747      History   Chief Complaint Chief Complaint  Patient presents with  . Vomiting    HPI Russell Hutchinson is a 50 y.o. male.   Patient is a 50 year old male who presents for evaluation of the above issue.  He reports he does not have a primary care provider.  He works as a Furniture conservator/restorer.  Per history he said that he went out to dinner this past Saturday evening May 7 and thinks he ate some bad chicken.  Started feeling poorly the following morning.  Started with some weakness.  He started vomiting the following day and his vomiting has persisted to the point where he said he vomited all night last night.  No blood or bile.  Initially was food and now is just stomach contents.  He denies any fever shakes chills.  He does have nausea.  No diarrhea.  He reports he is called out of work every day and has not been back to work since his symptoms began.  His last emesis was about 8 hours ago.  I have reviewed his medical record in detail.  It appears as though he has presented several times over the past few years with similar symptoms.  Seem to resolve with Zofran and IV fluids.  He denies any abdominal surgeries.  He also denies any history of Crohn's or ulcerative colitis.  He denies any chest pain shortness of breath.  No red flag signs or symptoms elicited on history.  He also denies any urinary symptoms.  No dysuria, hematuria, increased frequency or urgency.  No blood in the stool.  He said he has not been able to keep any food or fluids down.  He thinks he significantly dehydrated.       Past Medical History:  Diagnosis Date  . Lumbago     Patient Active Problem List   Diagnosis Date Noted  . Lumbago 10/31/2014    Past Surgical History:  Procedure Laterality Date  . ABDOMINAL SURGERY  as infant  . COLON SURGERY     blockage       Home Medications    Prior to Admission medications   Medication  Sig Start Date End Date Taking? Authorizing Provider  ondansetron (ZOFRAN) 4 MG tablet Take 1 tablet (4 mg total) by mouth every 6 (six) hours. 09/18/20  Yes Verda Cumins, MD  ondansetron (ZOFRAN ODT) 4 MG disintegrating tablet Take 1 tablet (4 mg total) by mouth every 8 (eight) hours as needed for nausea or vomiting. 12/05/19   Coral Spikes, DO  pantoprazole (PROTONIX) 40 MG tablet Take 1 tablet (40 mg total) by mouth daily. 12/05/19   Coral Spikes, DO    Family History Family History  Problem Relation Age of Onset  . Healthy Mother   . Healthy Father     Social History Social History   Tobacco Use  . Smoking status: Former Smoker    Quit date: 02/21/2008    Years since quitting: 12.5  . Smokeless tobacco: Never Used  Vaping Use  . Vaping Use: Never used  Substance Use Topics  . Alcohol use: Not Currently    Comment: socially  . Drug use: Yes    Frequency: 3.0 times per week    Types: Marijuana     Allergies   Zithromax [azithromycin]   Review of Systems Review of Systems  Constitutional: Positive for activity change  and appetite change. Negative for chills, diaphoresis, fatigue and fever.  HENT: Negative.  Negative for congestion, sinus pain and sore throat.   Eyes: Negative.  Negative for pain.  Respiratory: Negative for cough, choking, chest tightness, shortness of breath and wheezing.   Cardiovascular: Negative.  Negative for chest pain and palpitations.  Gastrointestinal: Positive for nausea and vomiting. Negative for abdominal pain, blood in stool and diarrhea.  Genitourinary: Negative for dysuria, flank pain, frequency, hematuria, penile discharge, penile pain, penile swelling, scrotal swelling, testicular pain and urgency.  Musculoskeletal: Negative.  Negative for arthralgias, back pain, myalgias, neck pain and neck stiffness.  Skin: Negative for color change, pallor, rash and wound.  Neurological: Negative for dizziness, seizures, light-headedness, numbness  and headaches.  All other systems reviewed and are negative.    Physical Exam Triage Vital Signs ED Triage Vitals  Enc Vitals Group     BP 09/18/20 1820 132/85     Pulse Rate 09/18/20 1820 66     Resp 09/18/20 1820 18     Temp 09/18/20 1820 99.1 F (37.3 C)     Temp Source 09/18/20 1820 Oral     SpO2 09/18/20 1820 97 %     Weight 09/18/20 1817 179 lb 14.3 oz (81.6 kg)     Height 09/18/20 1817 6' 1" (1.854 m)     Head Circumference --      Peak Flow --      Pain Score 09/18/20 1817 0     Pain Loc --      Pain Edu? --      Excl. in [place]? --    No data found.  Updated Vital Signs BP 132/85 (BP Location: Right Arm)   Pulse 66   Temp 99.1 F (37.3 C) (Oral)   Resp 18   Ht 6' 1" (1.854 m)   Wt 81.6 kg   SpO2 97%   BMI 23.73 kg/m   Visual Acuity Right Eye Distance:   Left Eye Distance:   Bilateral Distance:    Right Eye Near:   Left Eye Near:    Bilateral Near:     Physical Exam Vitals and nursing note reviewed.  Constitutional:      General: He is not in acute distress.    Appearance: Normal appearance. He is not ill-appearing, toxic-appearing or diaphoretic.     Comments: No acute distress, patient reports that he feels badly.  HENT:     Head: Normocephalic and atraumatic.     Right Ear: Tympanic membrane normal.     Left Ear: Tympanic membrane normal.     Nose: No congestion or rhinorrhea.     Mouth/Throat:     Mouth: Mucous membranes are moist.     Pharynx: No oropharyngeal exudate or posterior oropharyngeal erythema.  Eyes:     General: No scleral icterus.       Right eye: No discharge.        Left eye: No discharge.     Extraocular Movements: Extraocular movements intact.     Conjunctiva/sclera: Conjunctivae normal.     Pupils: Pupils are equal, round, and reactive to light.  Cardiovascular:     Rate and Rhythm: Normal rate and regular rhythm.     Pulses: Normal pulses.     Heart sounds: Normal heart sounds. No murmur heard. No friction rub. No  gallop.   Pulmonary:     Effort: Pulmonary effort is normal. No respiratory distress.     Breath sounds: Normal breath sounds.  No stridor. No wheezing, rhonchi or rales.  Abdominal:     General: Abdomen is flat. Bowel sounds are increased.     Palpations: Abdomen is soft. There is no shifting dullness, fluid wave, hepatomegaly or splenomegaly.     Tenderness: There is generalized abdominal tenderness. There is no right CVA tenderness, left CVA tenderness, guarding or rebound. Negative signs include Murphy's sign, Rovsing's sign, McBurney's sign, psoas sign and obturator sign.     Comments: Very minimal discomfort that is not localized.  It is generalized.  Musculoskeletal:     Cervical back: Normal range of motion and neck supple. No rigidity or tenderness.  Lymphadenopathy:     Cervical: No cervical adenopathy.  Skin:    General: Skin is warm and dry.     Capillary Refill: Capillary refill takes less than 2 seconds.     Coloration: Skin is not jaundiced.     Findings: No bruising, erythema, lesion or rash.  Neurological:     General: No focal deficit present.     Mental Status: He is alert and oriented to person, place, and time.      UC Treatments / Results  Labs (all labs ordered are listed, but only abnormal results are displayed) Labs Reviewed  URINALYSIS, COMPLETE (UACMP) WITH MICROSCOPIC - Abnormal; Notable for the following components:      Result Value   Hgb urine dipstick TRACE (*)    Bilirubin Urine SMALL (*)    Protein, ur 30 (*)    Bacteria, UA FEW (*)    All other components within normal limits  CBC WITH DIFFERENTIAL/PLATELET - Abnormal; Notable for the following components:   Platelets 124 (*)    Lymphs Abs 0.3 (*)    Monocytes Absolute 1.1 (*)    All other components within normal limits  COMPREHENSIVE METABOLIC PANEL - Abnormal; Notable for the following components:   Glucose, Bld 101 (*)    BUN 21 (*)    All other components within normal limits     EKG   Radiology No results found.  Procedures Procedures (including critical care time)  Medications Ordered in UC Medications  ondansetron (ZOFRAN) injection 4 mg (4 mg Intramuscular Given 09/18/20 1908)  sodium chloride 0.9 % bolus 1,000 mL (1,000 mLs Intravenous New Bag/Given 09/18/20 1909)    Initial Impression / Assessment and Plan / UC Course  I have reviewed the triage vital signs and the nursing notes.  Pertinent labs & imaging results that were available during my care of the patient were reviewed by me and considered in my medical decision making (see chart for details).  Clinical impression: Nausea with vomiting for 4 days.  Patient has some minimal abdominal tenderness but does not have an acute abdomen.  Per history appears as though he may have gotten a little bit behind on his fluids with anorexia due to the nausea and vomiting and decreased fluid intake.  Treatment plan: 1.  The findings and treatment plan were discussed in detail with the patient.  Patient was in agreement. 2.  Recommended getting a UA.  Did show small amount of bilirubin with a little bit of protein as well there were a few bacteria but is probably contaminant as there was no nitrites or leukocytes.  There was trace blood.  Will not send off the culture as I doubt he has a UTI. 3.  I felt he benefit from some IV fluids.  Prior to initiating that we did do a CMP and a  CBC.  It showed a normal white count, normal H&H, and an essentially normal differential.  His platelet count was low at 124 which is a little lower than what it was last time.  C-Met did show a glucose of 101 and a mildly elevated BUN at 21 with a creatinine of 1.22.  Electrolytes liver function were within normal limits. 4.  We did give him 1 L of normal saline and he responded quite well to this.  He felt much better afterwards. 5.  Educational handouts provided. 6.  Zofran IM was ordered and dispensed..  This also helped him as  well.  Sent in an oral prescription for Zofran that he can take at home. 7.  I did give him any information of Dr.Finnegan to see whether or not the thrombocytopenia that he has a red herring.  I will leave it to his discretion to make that appointment. 8.  Encouraged him to hydrate is much as possible.  Information given on a bland diet. 9.  He requested a work note and I gave him 1 saying he was seen today and we will just keep him out for the next 2 days. 10.  He does not have a primary care symptoms persist he should seek out care in urgent care.  If they worsen he should go to the ER. 11.  He was discharged in stable condition and he will follow-up here as needed.    Final Clinical Impressions(s) / UC Diagnoses   Final diagnoses:  Non-intractable vomiting with nausea, unspecified vomiting type  Decreased appetite  Thrombocytopenia ([place])     Discharge Instructions     As we discussed, you have vomiting and some nausea.  Your vital signs and physical exam are reassuring.  He received 1 L of IV fluids. I also gave you an intramuscular injection of a antinausea antivomiting medicine.  I have given you the same medication orally and you can get that from your pharmacy. Please see educational handouts. Please continue to hydrate. I gave you a work note saying you were seen today and keep you out the rest of the week. If your symptoms were to worsen in any way please go to the ER.    ED Prescriptions    Medication Sig Dispense Auth. Provider   ondansetron (ZOFRAN) 4 MG tablet Take 1 tablet (4 mg total) by mouth every 6 (six) hours. 12 tablet Verda Cumins, MD     PDMP not reviewed this encounter.   Verda Cumins, MD 09/19/20 1445
